# Patient Record
Sex: Male | Born: 1995 | Race: Black or African American | Hispanic: No | Marital: Single | Smoking: Never smoker
Health system: Southern US, Community
[De-identification: ages and names within clinical notes are randomized; demographics above are authoritative.]

## PROBLEM LIST (undated history)

## (undated) DIAGNOSIS — J302 Other seasonal allergic rhinitis: Secondary | ICD-10-CM

## (undated) DIAGNOSIS — J45909 Unspecified asthma, uncomplicated: Secondary | ICD-10-CM

---
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
## 2019-03-23 ENCOUNTER — Emergency Department (HOSPITAL_COMMUNITY)
Admission: EM | Admit: 2019-03-23 | Discharge: 2019-03-23 | Discharge status: Against Medical Advice | Payer: Self-pay | Attending: Emergency Medicine | Admitting: Emergency Medicine

## 2019-03-23 DIAGNOSIS — Z5321 Procedure and treatment not carried out due to patient leaving prior to being seen by health care provider: Secondary | ICD-10-CM | POA: Insufficient documentation
